# Patient Record
Sex: Male | Born: 1995
Health system: Southern US, Community
[De-identification: ages and names within clinical notes are randomized; demographics above are authoritative.]

## PROBLEM LIST (undated history)

## (undated) DIAGNOSIS — F909 Attention-deficit hyperactivity disorder, unspecified type: Secondary | ICD-10-CM

## (undated) DIAGNOSIS — T7840XA Allergy, unspecified, initial encounter: Secondary | ICD-10-CM

## (undated) HISTORY — DX: Attention-deficit hyperactivity disorder, unspecified type: F90.9

## (undated) HISTORY — PX: DENTAL SURGERY: SHX609

## (undated) HISTORY — DX: Allergy, unspecified, initial encounter: T78.40XA

---

## 2021-01-13 ENCOUNTER — Ambulatory Visit (INDEPENDENT_AMBULATORY_CARE_PROVIDER_SITE_OTHER): Payer: BC Managed Care – PPO

## 2021-01-13 ENCOUNTER — Other Ambulatory Visit: Payer: Self-pay

## 2021-01-13 ENCOUNTER — Encounter: Payer: Self-pay | Admitting: Emergency Medicine

## 2021-01-13 ENCOUNTER — Ambulatory Visit
Admission: EM | Admit: 2021-01-13 | Discharge: 2021-01-13 | Disposition: A | Discharge status: Home / Self Care | Payer: BC Managed Care – PPO | Attending: Family Medicine | Admitting: Family Medicine

## 2021-01-13 DIAGNOSIS — S62663A Nondisplaced fracture of distal phalanx of left middle finger, initial encounter for closed fracture: Secondary | ICD-10-CM | POA: Diagnosis not present

## 2021-01-13 DIAGNOSIS — S62632A Displaced fracture of distal phalanx of right middle finger, initial encounter for closed fracture: Secondary | ICD-10-CM | POA: Diagnosis not present

## 2021-01-13 DIAGNOSIS — S6992XA Unspecified injury of left wrist, hand and finger(s), initial encounter: Secondary | ICD-10-CM

## 2021-01-13 NOTE — Discharge Instructions (Addendum)
Xray today shows that the end of your left middle finger is broken  We have placed a splint to the area  May ice the area  May take tylenol/ibuprofen as needed for pain  Follow up with hand specialist or orthopedics

## 2021-01-13 NOTE — ED Triage Notes (Signed)
Smashed tips of fingers with hammer, mainly middle finger tip.

## 2021-01-13 NOTE — ED Triage Notes (Signed)
Left middle finger splint applies.  Skin and cap refill WNL.

## 2021-01-13 NOTE — ED Provider Notes (Signed)
[facility]    CSN: [id number] Arrival date & time: 01/13/21  1308      History   Chief Complaint Chief Complaint  Patient presents with  . Finger Injury    Left hand    HPI Kavari Parrillo is a 25 y.o. male.   Reports that he smashed his left first, second, and third fingers with a hammer accidentally today.  Reports that now the area is red, swollen, painful and difficult to move.  Reports that the most pain is in the tip of the left middle finger.  Has not attempted OTC treatment for this.  Denies previous symptoms.  Denies other injury, other pain, drainage from the area, foreign bodies from the area, bleeding.  ROS per HPI  The history is provided by the patient.    History reviewed. No pertinent past medical history.  There are no problems to display for this patient.   History reviewed. No pertinent surgical history.     Home Medications    Prior to Admission medications   Not on File    Family History No family history on file.  Social History Social History   Tobacco Use  . Smoking status: Never Smoker  . Smokeless tobacco: Never Used  Vaping Use  . Vaping Use: Never used  Substance Use Topics  . Alcohol use: Yes  . Drug use: Never     Allergies   Patient has no known allergies.   Review of Systems Review of Systems   Physical Exam Triage Vital Signs ED Triage Vitals  Enc Vitals Group     BP 01/13/21 1317 (!) 143/82     Pulse Rate 01/13/21 1317 72     Resp 01/13/21 1317 18     Temp 01/13/21 1317 98 F (36.7 C)     Temp Source 01/13/21 1317 Oral     SpO2 01/13/21 1317 97 %     Weight 01/13/21 1329 230 lb (104.3 kg)     Height --      Head Circumference --      Peak Flow --      Pain Score 01/13/21 1329 3     Pain Loc --      Pain Edu? --      Excl. in GC? --    No data found.  Updated Vital Signs BP (!) 143/82   Pulse 72   Temp 98 F (36.7 C) (Oral)   Resp 18   Wt 230 lb (104.3 kg)   SpO2 97%   Visual  Acuity Right Eye Distance:   Left Eye Distance:   Bilateral Distance:    Right Eye Near:   Left Eye Near:    Bilateral Near:     Physical Exam Vitals and nursing note reviewed.  Constitutional:      General: He is not in acute distress.    Appearance: Normal appearance. He is well-developed and normal weight. He is not ill-appearing.  HENT:     Head: Normocephalic and atraumatic.     Nose: Nose normal.     Mouth/Throat:     Mouth: Mucous membranes are moist.     Pharynx: Oropharynx is clear.  Eyes:     Extraocular Movements: Extraocular movements intact.     Conjunctiva/sclera: Conjunctivae normal.     Pupils: Pupils are equal, round, and reactive to light.  Cardiovascular:     Rate and Rhythm: Normal rate and regular rhythm.     Heart sounds: No murmur  heard.   Pulmonary:     Effort: Pulmonary effort is normal. No respiratory distress.     Breath sounds: Normal breath sounds.  Abdominal:     Palpations: Abdomen is soft.     Tenderness: There is no abdominal tenderness.  Musculoskeletal:        General: Swelling, tenderness and signs of injury present.     Cervical back: Normal range of motion and neck supple.     Comments: Left middle finger  Skin:    General: Skin is warm and dry.     Capillary Refill: Capillary refill takes less than 2 seconds.  Neurological:     General: No focal deficit present.     Mental Status: He is alert and oriented to person, place, and time.  Psychiatric:        Mood and Affect: Mood normal.        Behavior: Behavior normal.        Thought Content: Thought content normal.      UC Treatments / Results  Labs (all labs ordered are listed, but only abnormal results are displayed) Labs Reviewed - No data to display  EKG   Radiology No results found.  Procedures Procedures (including critical care time)  Medications Ordered in UC Medications - No data to display  Initial Impression / Assessment and Plan / UC Course  I have  reviewed the triage vital signs and the nursing notes.  Pertinent labs & imaging results that were available during my care of the patient were reviewed by me and considered in my medical decision making (see chart for details).    Injury of left hand Closed nondisplaced fracture of the distal phalanx of the left middle finger  Splint applied to the left middle finger in office May take ibuprofen and Tylenol as needed for pain May use ice to the area Keep the area elevated as well Follow-up with orthopedics or hand specialist Follow-up with the ER for unrelenting pain, skin color changes of the tip of the finger, change or loss of sensation of the left middle finger, other concerning symptoms  Final Clinical Impressions(s) / UC Diagnoses   Final diagnoses:  Injury of finger of left hand, initial encounter  Closed nondisplaced fracture of distal phalanx of left middle finger, initial encounter     Discharge Instructions     Xray today shows that the end of your left middle finger is broken  We have placed a splint to the area  May ice the area  May take tylenol/ibuprofen as needed for pain  Follow up with hand specialist or orthopedics   ED Prescriptions    None     PDMP not reviewed this encounter.   Moshe Cipro, NP 01/19/21 1026

## 2021-04-23 ENCOUNTER — Encounter: Payer: Self-pay | Admitting: Nurse Practitioner

## 2021-04-23 ENCOUNTER — Other Ambulatory Visit: Payer: Self-pay

## 2021-04-23 ENCOUNTER — Ambulatory Visit (INDEPENDENT_AMBULATORY_CARE_PROVIDER_SITE_OTHER): Payer: BC Managed Care – PPO | Admitting: Nurse Practitioner

## 2021-04-23 VITALS — BP 128/79 | HR 73 | Temp 97.6°F | Ht 73.0 in | Wt 238.0 lb

## 2021-04-23 DIAGNOSIS — Z0001 Encounter for general adult medical examination with abnormal findings: Secondary | ICD-10-CM | POA: Diagnosis not present

## 2021-04-23 DIAGNOSIS — Z7689 Persons encountering health services in other specified circumstances: Secondary | ICD-10-CM | POA: Diagnosis not present

## 2021-04-23 DIAGNOSIS — F909 Attention-deficit hyperactivity disorder, unspecified type: Secondary | ICD-10-CM | POA: Insufficient documentation

## 2021-04-23 DIAGNOSIS — Z139 Encounter for screening, unspecified: Secondary | ICD-10-CM | POA: Diagnosis not present

## 2021-04-23 MED ORDER — ATOMOXETINE HCL 40 MG PO CAPS: 40.0000 mg | ORAL_CAPSULE | Freq: Every day | ORAL | 1 refills | Status: DC

## 2021-04-23 NOTE — Progress Notes (Signed)
New Patient Office Visit  Subjective:  Patient ID: Mark Kemp, male    DOB: 10-09-1995  Age: 25 y.o. MRN: [id number]  CC:  Chief Complaint  Patient presents with   New Patient (Initial Visit)    Here to establish care. Wants to discuss ADHD medication today and recent DOT physical.    HPI Mark Kemp presents for new patient visit. Transferring care from [place]; a pediatric office in [place]. Last physical was several years ago. Last labs were several years ago.  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  He has been taking half of the prescribed dose.    Past Medical History:  Diagnosis Date   ADHD    Allergy    horses and cats    Past Surgical History:  Procedure Laterality Date   DENTAL SURGERY      Family History  Problem Relation Age of Onset   Hypertension Mother     Social History   Socioeconomic History   Marital status: Married    Spouse name: Not on file   Number of children: 2   Years of education: Not on file   Highest education level: Not on file  Occupational History   Occupation: Land    Employer: [employer]  Tobacco Use   Smoking status: Some Days    Types: Pipe   Smokeless tobacco: Never  Vaping Use   Vaping Use: Never used  Substance and Sexual Activity   Alcohol use: Yes    Comment: 2-3 beers per week   Drug use: Never   Sexual activity: Yes  Other Topics Concern   Not on file  Social History Narrative   2 kids now (oldest is 35, youngest is 1), [address] the [address]   Social Determinants of Health   Financial Resource Strain: Not on file  Food Insecurity: Not on file  Transportation Needs: Not on file  Physical Activity: Not on file  Stress: Not on file  Social Connections: Not on file  Intimate Partner Violence: Not on file    ROS Review of Systems  Constitutional: Negative.   Respiratory: Negative.     Cardiovascular: Negative.   Musculoskeletal: Negative.   Psychiatric/Behavioral:         ADHD   Objective:   Today's Vitals: BP 128/79 (BP Location: Left Arm, Patient Position: Sitting, Cuff Size: Large)   Pulse 73   Temp 97.6 F (36.4 C) (Temporal)   Ht '6\' 1"'  (1.854 m)   Wt 238 lb (108 kg)   SpO2 97%   BMI 31.40 kg/m   Physical Exam Constitutional:      Appearance: Normal appearance.  Cardiovascular:     Rate and Rhythm: Normal rate and regular rhythm.     Pulses: Normal pulses.     Heart sounds: Normal heart sounds.  Pulmonary:     Effort: Pulmonary effort is normal.     Breath sounds: Normal breath sounds.  Neurological:     Mental Status: He is alert.  Psychiatric:        Mood and Affect: Mood normal.        Behavior: Behavior normal.        Thought Content: Thought content normal.        Judgment: Judgment normal.    Assessment & Plan:   Problem List Items Addressed This Visit  Other   Encounter to establish care - Primary   Screening due    -will screen for HCV and HIV with next set of labs       Relevant Orders   Hepatitis C Antibody   HIV antibody (with reflex)   ADHD    -STOP amphetamine ER (15.7 mg TBED) -Rx. strattera -he is requesting non-stimulant medication so he can get DOT physical q2years       Relevant Medications   atomoxetine (STRATTERA) 40 MG capsule   Other Visit Diagnoses     Encounter for general adult medical examination with abnormal findings       Relevant Orders   CBC with Differential/Platelet   CMP14+EGFR   Lipid Panel With LDL/HDL Ratio   TSH       Outpatient Encounter Medications as of 04/23/2021  Medication Sig   atomoxetine (STRATTERA) 40 MG capsule Take 1 capsule (40 mg total) by mouth daily.   [DISCONTINUED] Amphetamine ER (ADZENYS XR-ODT) 15.7 MG TBED Take 1 tablet by mouth daily as needed.   No facility-administered encounter medications on file as of 04/23/2021.    Follow-up: Return in about 1  month (around 05/24/2021) for Physical Exam.   Noreene Larsson, NP

## 2021-04-23 NOTE — Assessment & Plan Note (Addendum)
-  STOP amphetamine ER (15.7 mg TBED) -Rx. strattera -he is requesting non-stimulant medication so he can get DOT physical q2years

## 2021-04-23 NOTE — Patient Instructions (Signed)
Please have fasting labs drawn 2-3 days prior to your appointment so we can discuss the results during your office visit.  

## 2021-04-23 NOTE — Assessment & Plan Note (Signed)
-  will screen for HCV and HIV with next set of labs 

## 2021-05-22 DIAGNOSIS — Z0001 Encounter for general adult medical examination with abnormal findings: Secondary | ICD-10-CM | POA: Diagnosis not present

## 2021-05-22 DIAGNOSIS — Z139 Encounter for screening, unspecified: Secondary | ICD-10-CM | POA: Diagnosis not present

## 2021-05-23 LAB — CBC WITH DIFFERENTIAL/PLATELET
Basophils Absolute: 0.1 10*3/uL (ref 0.0–0.2)
Basos: 1 %
EOS (ABSOLUTE): 0.4 10*3/uL (ref 0.0–0.4)
Eos: 6 %
Hematocrit: 45.8 % (ref 37.5–51.0)
Hemoglobin: 15.3 g/dL (ref 13.0–17.7)
Immature Grans (Abs): 0 10*3/uL (ref 0.0–0.1)
Immature Granulocytes: 0 %
Lymphocytes Absolute: 2 10*3/uL (ref 0.7–3.1)
Lymphs: 28 %
MCH: 28.5 pg (ref 26.6–33.0)
MCHC: 33.4 g/dL (ref 31.5–35.7)
MCV: 85 fL (ref 79–97)
Monocytes Absolute: 0.5 10*3/uL (ref 0.1–0.9)
Monocytes: 8 %
Neutrophils Absolute: 4 10*3/uL (ref 1.4–7.0)
Neutrophils: 57 %
Platelets: 262 10*3/uL (ref 150–450)
RBC: 5.37 x10E6/uL (ref 4.14–5.80)
RDW: 12.5 % (ref 11.6–15.4)
WBC: 7 10*3/uL (ref 3.4–10.8)

## 2021-05-23 LAB — CMP14+EGFR
ALT: 17 IU/L (ref 0–44)
AST: 17 IU/L (ref 0–40)
Albumin/Globulin Ratio: 2.8 — ABNORMAL HIGH (ref 1.2–2.2)
Albumin: 5 g/dL (ref 4.1–5.2)
Alkaline Phosphatase: 88 IU/L (ref 44–121)
BUN/Creatinine Ratio: 10 (ref 9–20)
BUN: 11 mg/dL (ref 6–20)
Bilirubin Total: 0.5 mg/dL (ref 0.0–1.2)
CO2: 22 mmol/L (ref 20–29)
Calcium: 9.5 mg/dL (ref 8.7–10.2)
Chloride: 104 mmol/L (ref 96–106)
Creatinine, Ser: 1.11 mg/dL (ref 0.76–1.27)
Globulin, Total: 1.8 g/dL (ref 1.5–4.5)
Glucose: 93 mg/dL (ref 65–99)
Potassium: 4.6 mmol/L (ref 3.5–5.2)
Sodium: 141 mmol/L (ref 134–144)
Total Protein: 6.8 g/dL (ref 6.0–8.5)
eGFR: 95 mL/min/{1.73_m2} (ref >59)

## 2021-05-23 LAB — TSH: TSH: 1.36 u[IU]/mL (ref 0.450–4.500)

## 2021-05-23 LAB — LIPID PANEL WITH LDL/HDL RATIO
Cholesterol, Total: 153 mg/dL (ref 100–199)
HDL: 40 mg/dL (ref >39)
LDL Chol Calc (NIH): 96 mg/dL (ref 0–99)
LDL/HDL Ratio: 2.4 ratio (ref 0.0–3.6)
Triglycerides: 88 mg/dL (ref 0–149)
VLDL Cholesterol Cal: 17 mg/dL (ref 5–40)

## 2021-05-23 LAB — HEPATITIS C ANTIBODY: Hep C Virus Ab: <0.1 s/co ratio (ref 0.0–0.9)

## 2021-05-23 LAB — HIV ANTIBODY (ROUTINE TESTING W REFLEX): HIV Screen 4th Generation wRfx: NONREACTIVE

## 2021-05-23 NOTE — Progress Notes (Signed)
Labs look great.

## 2021-05-29 ENCOUNTER — Other Ambulatory Visit: Payer: Self-pay

## 2021-05-29 ENCOUNTER — Encounter: Payer: Self-pay | Admitting: Nurse Practitioner

## 2021-05-29 ENCOUNTER — Ambulatory Visit (INDEPENDENT_AMBULATORY_CARE_PROVIDER_SITE_OTHER): Payer: BC Managed Care – PPO | Admitting: Nurse Practitioner

## 2021-05-29 DIAGNOSIS — F909 Attention-deficit hyperactivity disorder, unspecified type: Secondary | ICD-10-CM | POA: Diagnosis not present

## 2021-05-29 DIAGNOSIS — Z0001 Encounter for general adult medical examination with abnormal findings: Secondary | ICD-10-CM

## 2021-05-29 MED ORDER — ATOMOXETINE HCL 40 MG PO CAPS: 40.0000 mg | ORAL_CAPSULE | Freq: Every day | ORAL | 1 refills | Status: AC

## 2021-05-29 NOTE — Progress Notes (Signed)
Established Patient Office Visit  Subjective:  Patient ID: Mark Kemp, male    DOB: 1996-07-09  Age: 25 y.o. MRN: [id number]  CC:  Chief Complaint  Patient presents with   Annual Exam    CPE    HPI Mark Kemp presents for physical exam.  At his last OV, he swapped from amphetamine ER to strattera. He wanted to try no stimulant ADHD medication so he could get a DOT physical every 2 years instead of annually.  Past Medical History:  Diagnosis Date   ADHD    Allergy    horses and cats    Past Surgical History:  Procedure Laterality Date   DENTAL SURGERY      Family History  Problem Relation Age of Onset   Hypertension Mother     Social History   Socioeconomic History   Marital status: Married    Spouse name: Not on file   Number of children: 2   Years of education: Not on file   Highest education level: Not on file  Occupational History   Occupation: Land    Employer: [employer]  Tobacco Use   Smoking status: Some Days    Types: Pipe   Smokeless tobacco: Never  Vaping Use   Vaping Use: Never used  Substance and Sexual Activity   Alcohol use: Yes    Comment: 2-3 beers per week   Drug use: Never   Sexual activity: Yes  Other Topics Concern   Not on file  Social History Narrative   2 kids now (oldest is 98, youngest is 1), [address]   Social Determinants of Health   Financial Resource Strain: Not on file  Food Insecurity: Not on file  Transportation Needs: Not on file  Physical Activity: Not on file  Stress: Not on file  Social Connections: Not on file  Intimate Partner Violence: Not on file    Outpatient Medications Prior to Visit  Medication Sig Dispense Refill   atomoxetine (STRATTERA) 40 MG capsule Take 1 capsule (40 mg total) by mouth daily. 30 capsule 1   No facility-administered medications prior to visit.    No Known Allergies  ROS Review of Systems  Constitutional: Negative.   HENT: Negative.    Eyes: Negative.    Respiratory: Negative.    Cardiovascular: Negative.   Gastrointestinal: Negative.   Endocrine: Negative.   Genitourinary: Negative.   Musculoskeletal: Negative.   Skin: Negative.   Allergic/Immunologic: Negative.   Neurological: Negative.   Hematological: Negative.   Psychiatric/Behavioral: Negative.       Objective:    Physical Exam Constitutional:      Appearance: Normal appearance.  HENT:     Head: Normocephalic and atraumatic.     Right Ear: Tympanic membrane, ear canal and external ear normal.     Left Ear: Tympanic membrane, ear canal and external ear normal.     Nose: Nose normal.     Mouth/Throat:     Mouth: Mucous membranes are moist.     Pharynx: Oropharynx is clear.  Eyes:     Extraocular Movements: Extraocular movements intact.     Conjunctiva/sclera: Conjunctivae normal.     Pupils: Pupils are equal, round, and reactive to light.  Cardiovascular:     Rate and Rhythm: Normal rate and regular rhythm.     Pulses: Normal pulses.     Heart sounds: Normal heart sounds.  Pulmonary:     Effort: Pulmonary effort is normal.  Breath sounds: Normal breath sounds.  Abdominal:     General: Abdomen is flat. Bowel sounds are normal.     Palpations: Abdomen is soft.  Musculoskeletal:        General: Normal range of motion.     Cervical back: Normal range of motion and neck supple.  Skin:    General: Skin is warm and dry.     Capillary Refill: Capillary refill takes less than 2 seconds.  Neurological:     General: No focal deficit present.     Mental Status: He is alert and oriented to person, place, and time.     Cranial Nerves: No cranial nerve deficit.     Motor: No weakness.     Coordination: Coordination normal.     Gait: Gait normal.  Psychiatric:        Mood and Affect: Mood normal.        Behavior: Behavior normal.        Thought Content: Thought content normal.        Judgment: Judgment normal.    BP 133/77 (BP Location: Left Arm, Patient Position:  Sitting, Cuff Size: Large)   Pulse 69   Temp 98.2 F (36.8 C) (Oral)   Ht '6\' 1"'  (1.854 m)   Wt 233 lb (105.7 kg)   SpO2 97%   BMI 30.74 kg/m  Wt Readings from Last 3 Encounters:  05/29/21 233 lb (105.7 kg)  04/23/21 238 lb (108 kg)  01/13/21 230 lb (104.3 kg)     There are no preventive care reminders to display for this patient.  There are no preventive care reminders to display for this patient.  Lab Results  Component Value Date   TSH 1.360 05/22/2021   Lab Results  Component Value Date   WBC 7.0 05/22/2021   HGB 15.3 05/22/2021   HCT 45.8 05/22/2021   MCV 85 05/22/2021   PLT 262 05/22/2021   Lab Results  Component Value Date   NA 141 05/22/2021   K 4.6 05/22/2021   CO2 22 05/22/2021   GLUCOSE 93 05/22/2021   BUN 11 05/22/2021   CREATININE 1.11 05/22/2021   BILITOT 0.5 05/22/2021   ALKPHOS 88 05/22/2021   AST 17 05/22/2021   ALT 17 05/22/2021   PROT 6.8 05/22/2021   ALBUMIN 5.0 05/22/2021   CALCIUM 9.5 05/22/2021   EGFR 95 05/22/2021   Lab Results  Component Value Date   CHOL 153 05/22/2021   Lab Results  Component Value Date   HDL 40 05/22/2021   Lab Results  Component Value Date   LDLCALC 96 05/22/2021   Lab Results  Component Value Date   TRIG 88 05/22/2021   No results found for: CHOLHDL No results found for: HGBA1C    Assessment & Plan:   Problem List Items Addressed This Visit       Other   Encounter for general adult medical examination with abnormal findings    -physical exam unremarkable -doing well with strattera      ADHD    -doing well with strattera -sent in 65-monthrefill -could increase dose to 40 mg BID, but since he is doing well with 40 mg daily, no change at this time      Relevant Medications   atomoxetine (STRATTERA) 40 MG capsule (Start on 06/28/2021)    Meds ordered this encounter  Medications   atomoxetine (STRATTERA) 40 MG capsule    Sig: Take 1 capsule (40 mg total) by mouth daily.  Dispense:   90 capsule    Refill:  1    -please stop the Rx for 30-day fills of atomoxetine, and he will get the 90-day fills    Follow-up: Return in about 3 months (around 08/28/2021) for Med check (Strattera).    Noreene Larsson, NP

## 2021-05-29 NOTE — Assessment & Plan Note (Signed)
-  physical exam unremarkable -doing well with strattera

## 2021-05-29 NOTE — Assessment & Plan Note (Addendum)
-  doing well with strattera -sent in 59-month refill -could increase dose to 40 mg BID, but since he is doing well with 40 mg daily, no change at this time

## 2021-08-28 ENCOUNTER — Ambulatory Visit: Payer: BC Managed Care – PPO | Admitting: Nurse Practitioner

## 2022-11-03 IMAGING — DX DG HAND COMPLETE 3+V*L*
3 series · 3 of 3 positions shown · non-contrast
Comparison: None.

CLINICAL DATA: Smash the tip of left long finger with a hammer.

EXAM:
LEFT HAND - COMPLETE 3+ VIEW

[hand pa]
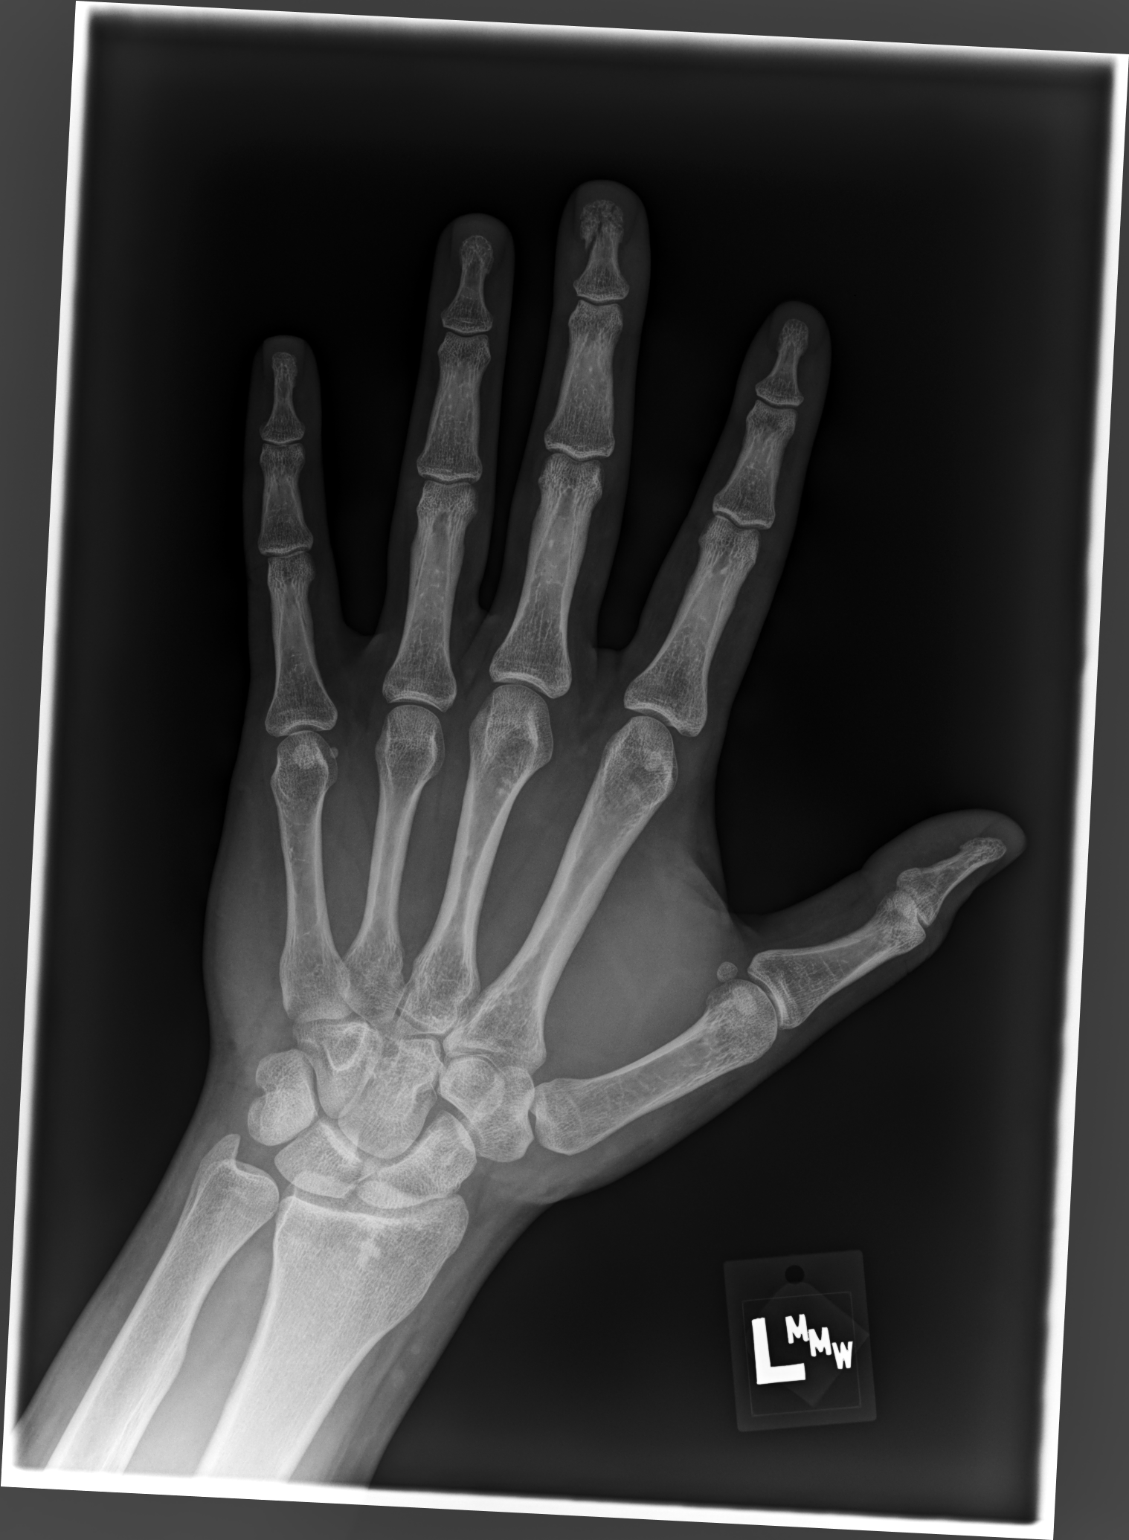

[hand mlo]
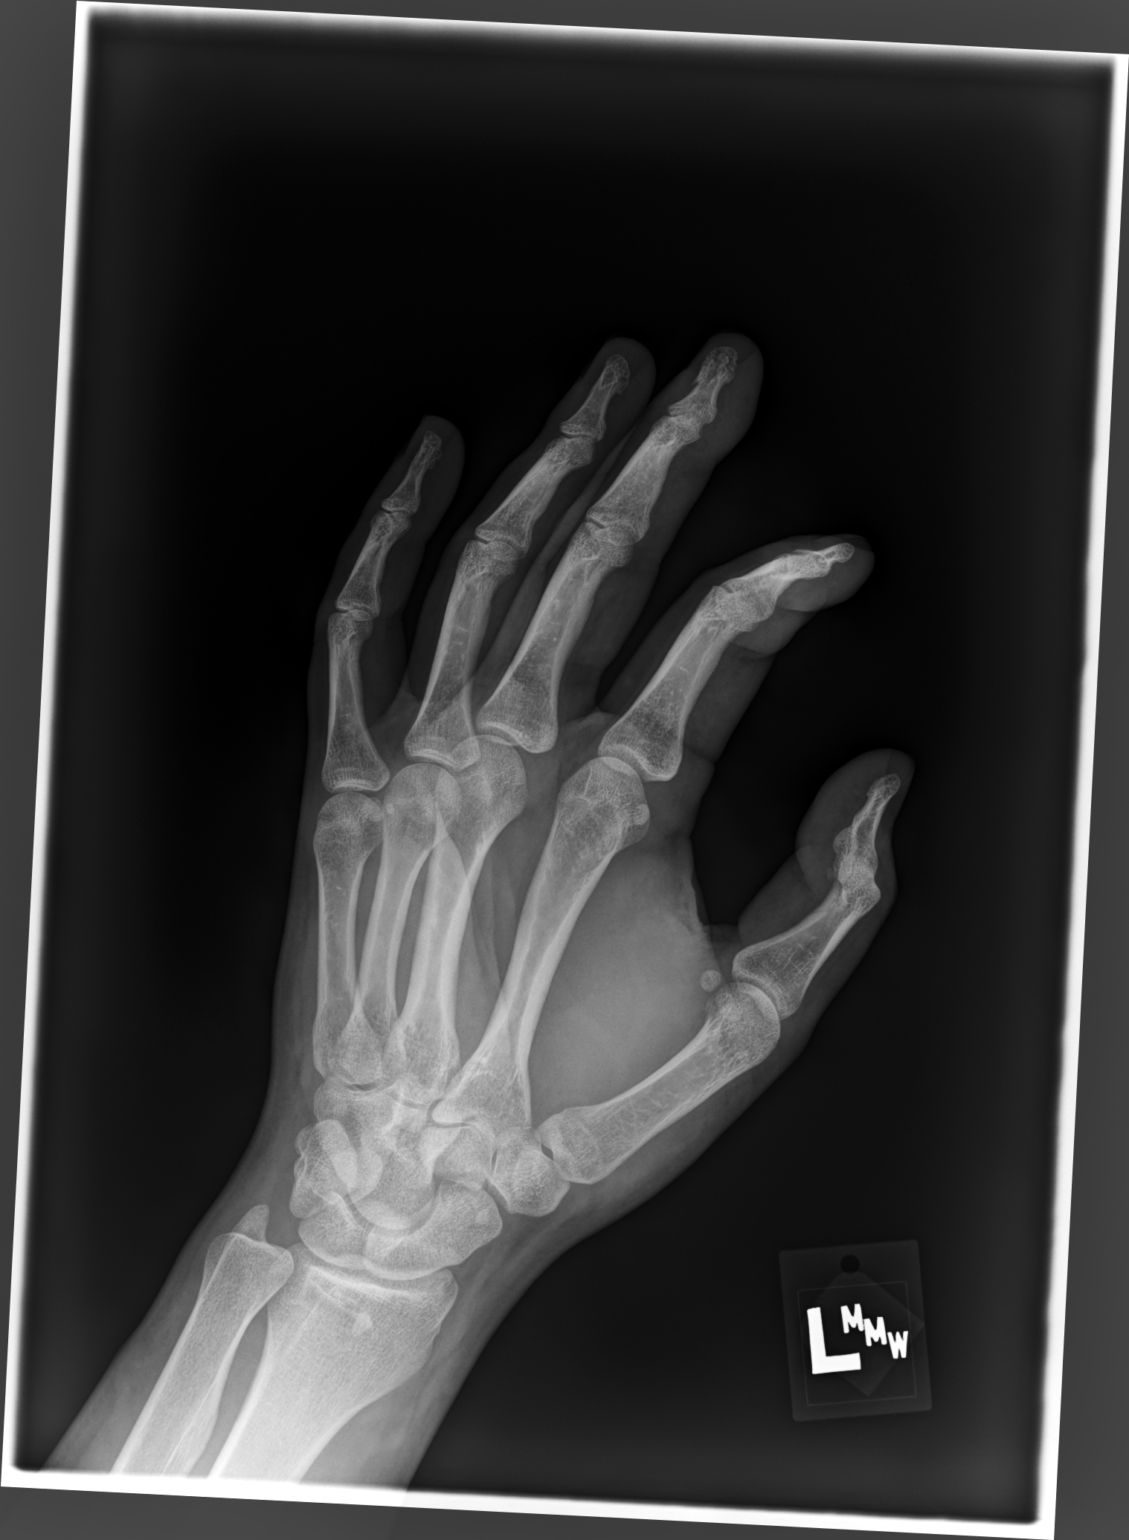

[hand lat]
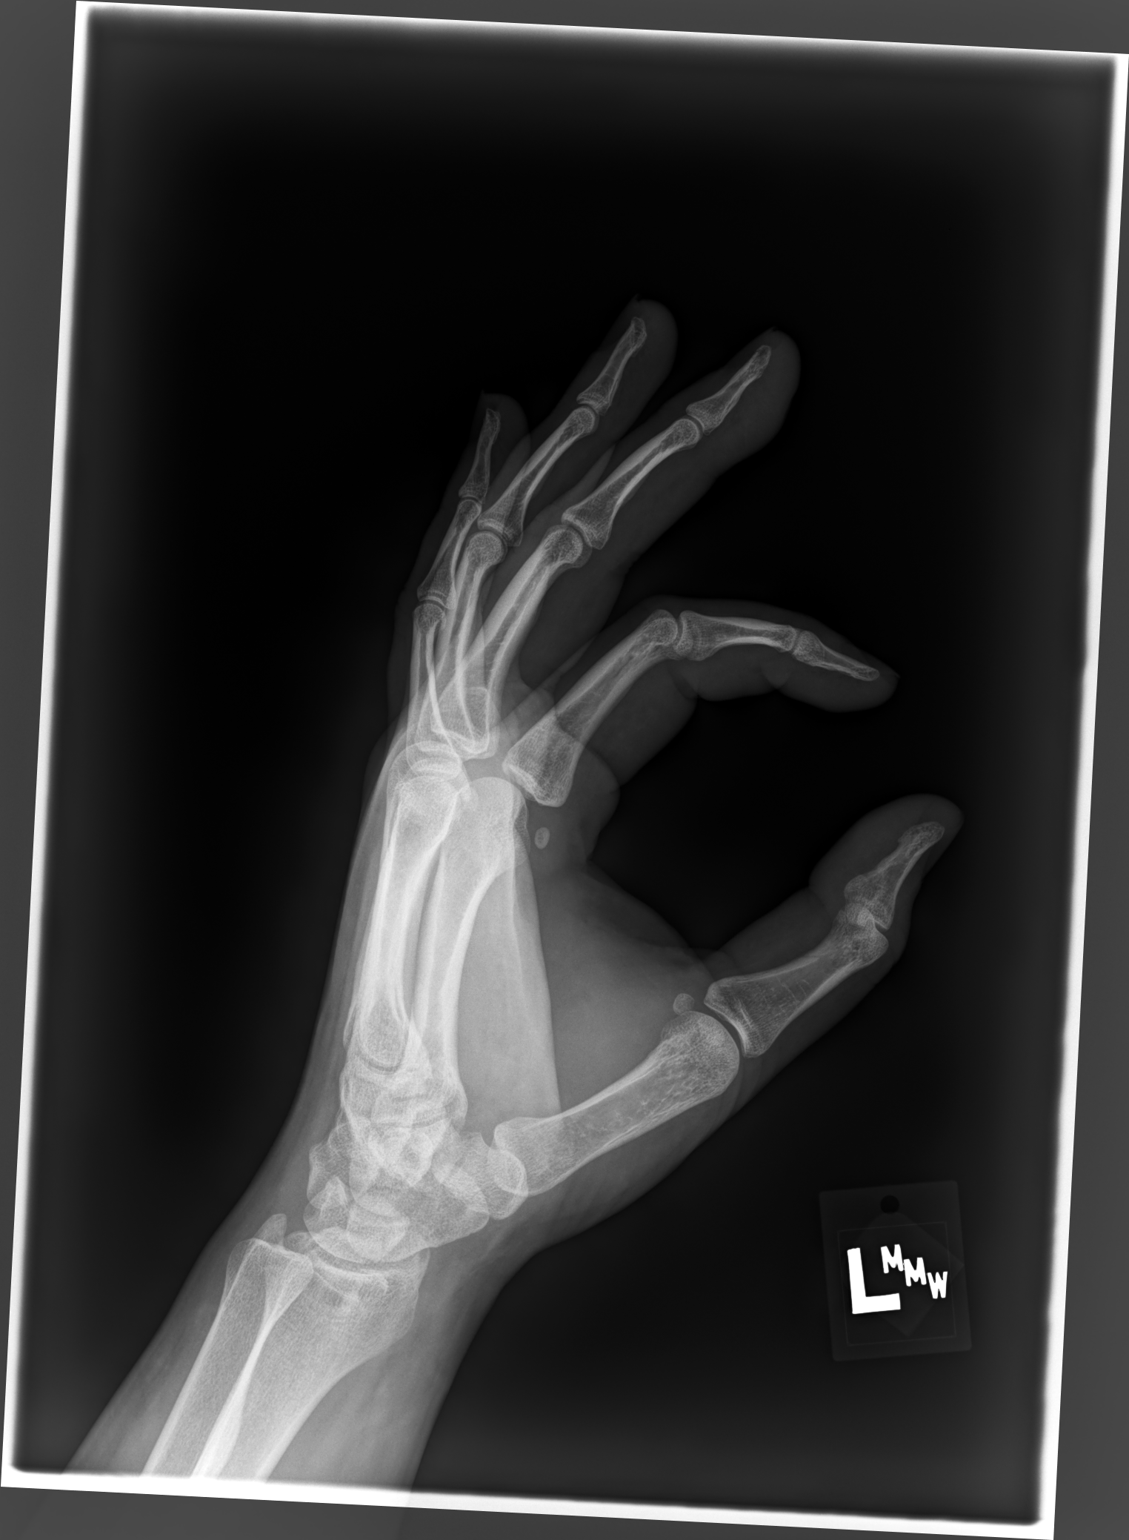

[3 of 3 positions shown; findings below may reference images not displayed]

FINDINGS: Mildly comminuted distal tuft fracture of the long finger.

The joint spaces are maintained.  No other fractures are identified.
IMPRESSION: Comminuted distal tuft fracture of the long finger.

## 2022-12-10 ENCOUNTER — Ambulatory Visit: Payer: BC Managed Care – PPO | Admitting: Family Medicine

## 2024-02-27 DIAGNOSIS — Z3009 Encounter for other general counseling and advice on contraception: Secondary | ICD-10-CM | POA: Diagnosis not present

## 2024-02-27 DIAGNOSIS — F902 Attention-deficit hyperactivity disorder, combined type: Secondary | ICD-10-CM | POA: Diagnosis not present

## 2024-02-27 DIAGNOSIS — Z7689 Persons encountering health services in other specified circumstances: Secondary | ICD-10-CM | POA: Diagnosis not present

## 2024-03-21 DIAGNOSIS — J020 Streptococcal pharyngitis: Secondary | ICD-10-CM | POA: Diagnosis not present

## 2024-03-31 DIAGNOSIS — F900 Attention-deficit hyperactivity disorder, predominantly inattentive type: Secondary | ICD-10-CM | POA: Diagnosis not present

## 2024-03-31 DIAGNOSIS — Z13220 Encounter for screening for lipoid disorders: Secondary | ICD-10-CM | POA: Diagnosis not present

## 2024-03-31 DIAGNOSIS — Z0000 Encounter for general adult medical examination without abnormal findings: Secondary | ICD-10-CM | POA: Diagnosis not present

## 2024-03-31 DIAGNOSIS — R899 Unspecified abnormal finding in specimens from other organs, systems and tissues: Secondary | ICD-10-CM | POA: Diagnosis not present

## 2024-03-31 DIAGNOSIS — E7800 Pure hypercholesterolemia, unspecified: Secondary | ICD-10-CM | POA: Diagnosis not present

## 2024-05-07 DIAGNOSIS — Z3009 Encounter for other general counseling and advice on contraception: Secondary | ICD-10-CM | POA: Diagnosis not present
